# Patient Record
Sex: Female | Born: 1945 | Race: White | Hispanic: No | Marital: Single | Smoking: Never smoker
Health system: Southern US, Community
[De-identification: ages and names within clinical notes are randomized; demographics above are authoritative.]

---

## 2013-05-23 ENCOUNTER — Encounter (HOSPITAL_COMMUNITY): Payer: Self-pay | Admitting: Emergency Medicine

## 2013-05-23 ENCOUNTER — Emergency Department (HOSPITAL_COMMUNITY): Payer: Medicare PPO

## 2013-05-23 ENCOUNTER — Emergency Department (HOSPITAL_COMMUNITY)
Admission: EM | Admit: 2013-05-23 | Discharge: 2013-05-23 | Disposition: A | Discharge status: Home / Self Care | Payer: Medicare PPO | Attending: Emergency Medicine | Admitting: Emergency Medicine

## 2013-05-23 DIAGNOSIS — W010XXA Fall on same level from slipping, tripping and stumbling without subsequent striking against object, initial encounter: Secondary | ICD-10-CM | POA: Insufficient documentation

## 2013-05-23 DIAGNOSIS — W19XXXA Unspecified fall, initial encounter: Secondary | ICD-10-CM

## 2013-05-23 DIAGNOSIS — S82102A Unspecified fracture of upper end of left tibia, initial encounter for closed fracture: Secondary | ICD-10-CM

## 2013-05-23 DIAGNOSIS — Z79899 Other long term (current) drug therapy: Secondary | ICD-10-CM | POA: Insufficient documentation

## 2013-05-23 DIAGNOSIS — Y92009 Unspecified place in unspecified non-institutional (private) residence as the place of occurrence of the external cause: Secondary | ICD-10-CM | POA: Insufficient documentation

## 2013-05-23 DIAGNOSIS — S82109A Unspecified fracture of upper end of unspecified tibia, initial encounter for closed fracture: Secondary | ICD-10-CM | POA: Insufficient documentation

## 2013-05-23 DIAGNOSIS — Y9301 Activity, walking, marching and hiking: Secondary | ICD-10-CM | POA: Insufficient documentation

## 2013-05-23 DIAGNOSIS — Z23 Encounter for immunization: Secondary | ICD-10-CM | POA: Insufficient documentation

## 2013-05-23 IMAGING — CR DG KNEE COMPLETE 4+V*L*
5 series · 5 of 5 positions shown · non-contrast
Comparison: None.

CLINICAL DATA: Status post fall.  Left knee pain.

EXAM:
LEFT KNEE - COMPLETE 4+ VIEW

[t knee ap left]
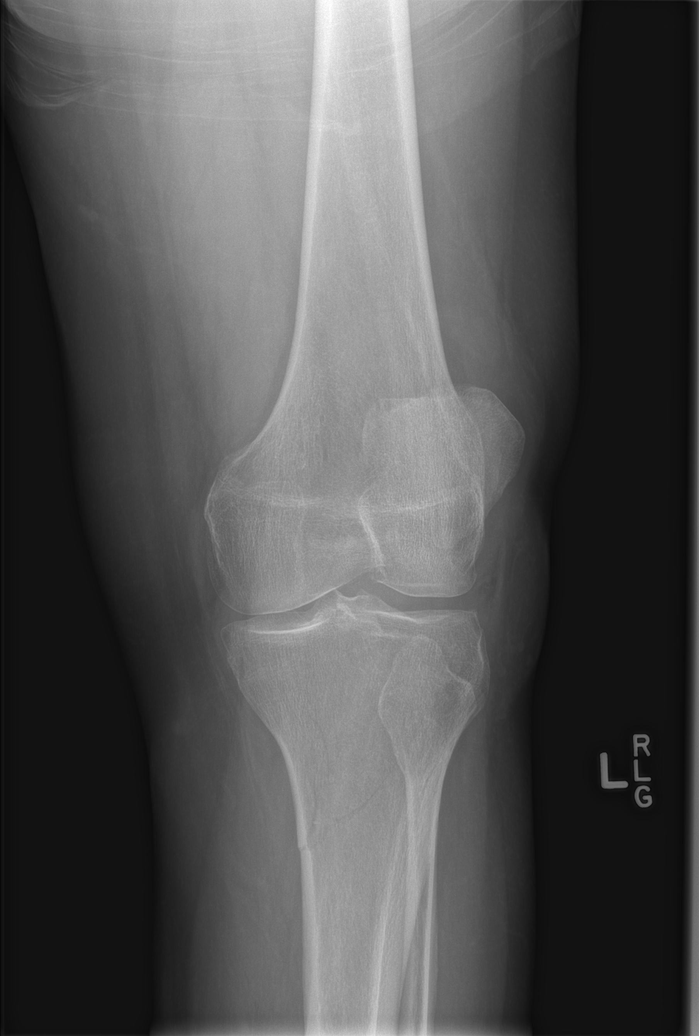

[t knee obl left (1 of 2)]
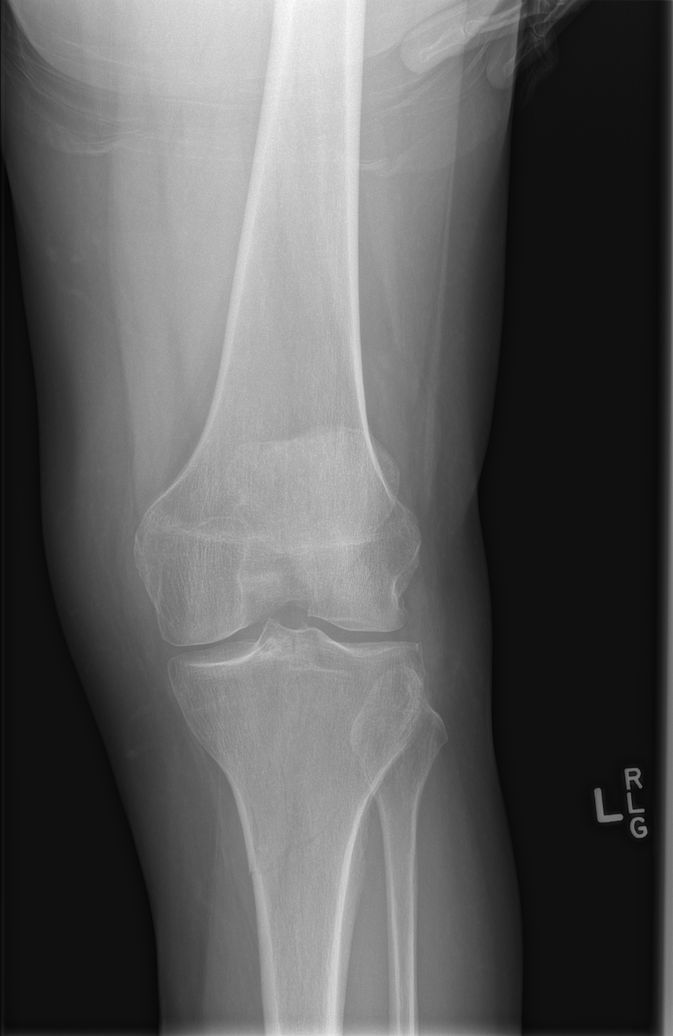

[t knee obl left (2 of 2)]
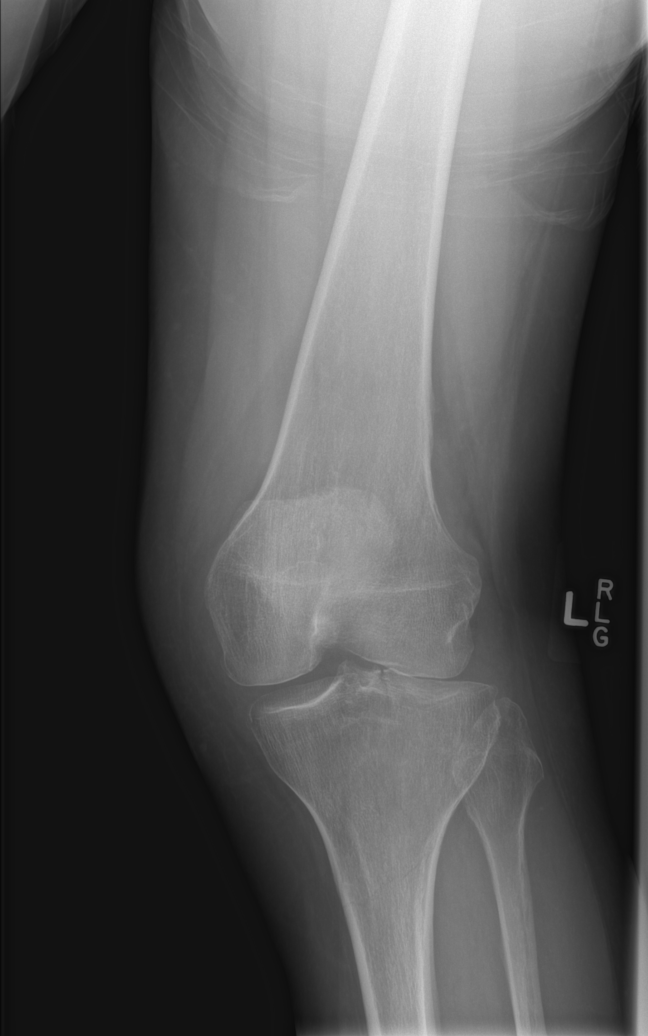

[t knee lat left (1 of 2)]
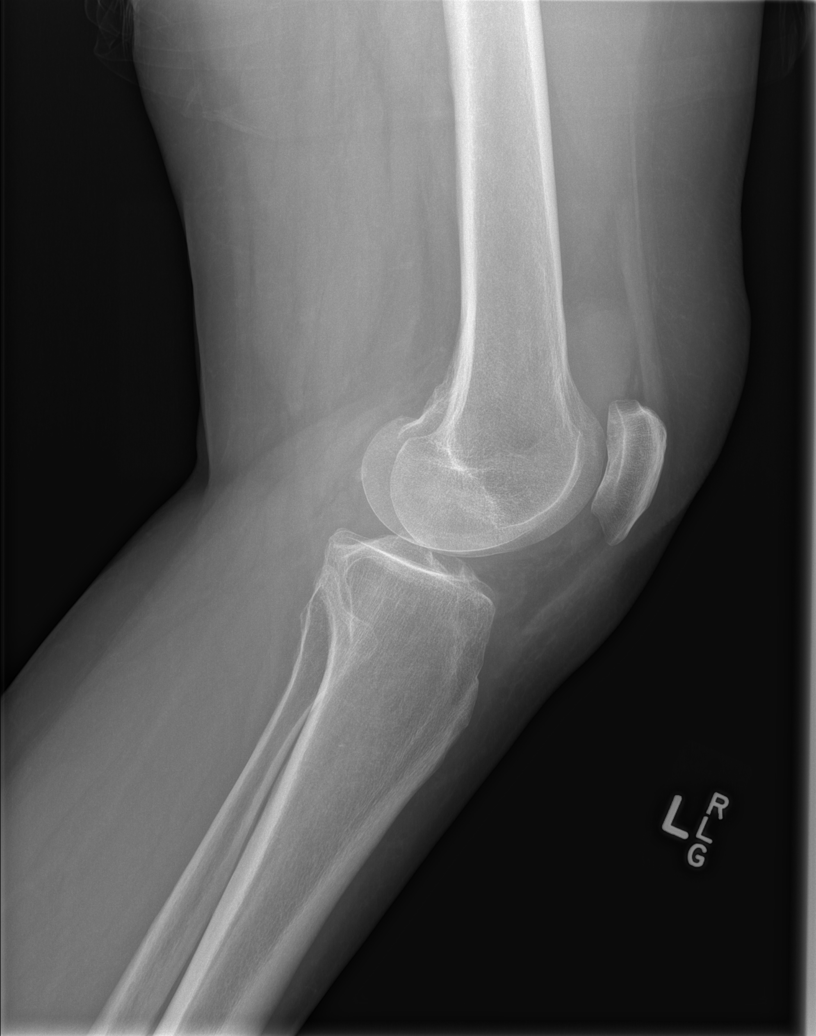

[t knee lat left (2 of 2)]
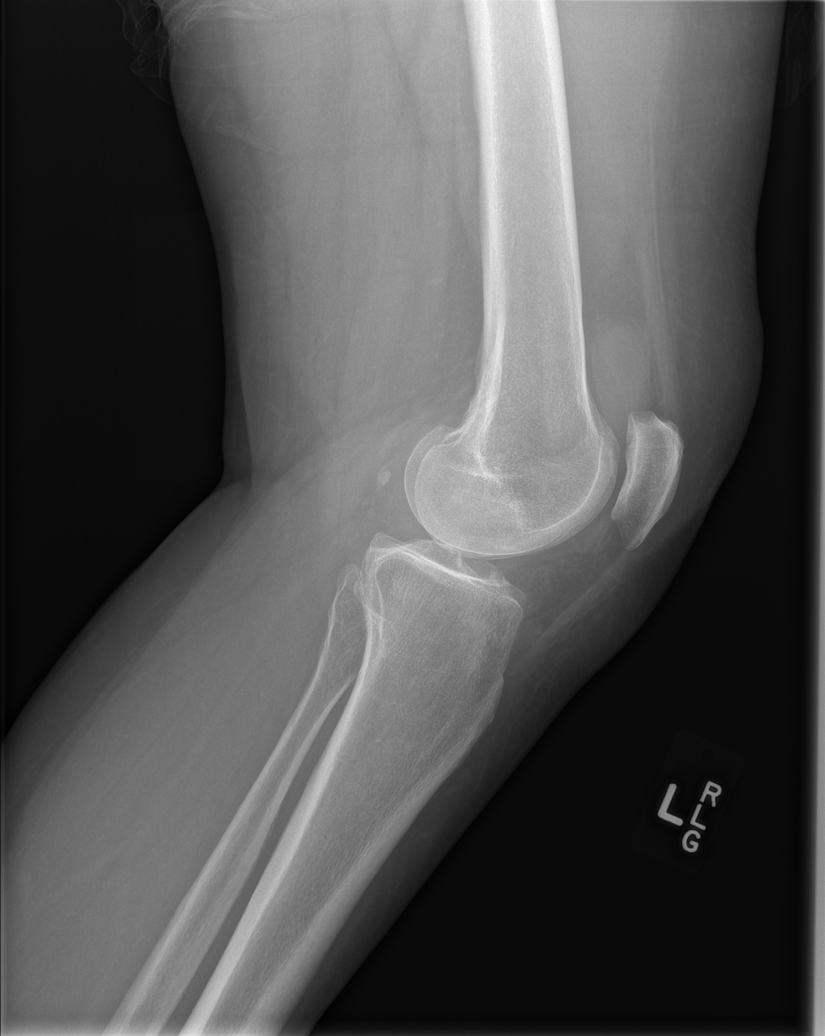

[5 of 5 positions shown; findings below may reference images not displayed]

FINDINGS: The patient has a nondisplaced tibial fracture. The fracture extends
from the tibial eminences inferiorly through the medial tibial
metaphysis 8.5 cm below the medial tibial plateau. Associated joint
effusion is noted. No other acute abnormality is identified.
IMPRESSION: Nondisplaced proximal tibial fracture extends from the tibial
eminences into the tibial diaphysis approximately 8.5 cm below the
medial tibial plateau Associated joint effusion is noted.

## 2013-05-23 MED ORDER — TETANUS-DIPHTH-ACELL PERTUSSIS 5-2.5-18.5 LF-MCG/0.5 IM SUSP
0.5000 mL | Freq: Once | INTRAMUSCULAR | Status: AC
  Administered 2013-05-23: 0.5 mL via INTRAMUSCULAR
  Filled 2013-05-23: qty 0.5

## 2013-05-23 MED ORDER — OXYCODONE-ACETAMINOPHEN 5-325 MG PO TABS: 1.0000 | ORAL_TABLET | ORAL | Status: AC | PRN

## 2013-05-23 MED ORDER — MORPHINE SULFATE 4 MG/ML IJ SOLN
4.0000 mg | Freq: Once | INTRAMUSCULAR | Status: AC
Start: 2013-05-23 — End: 2013-05-23
  Administered 2013-05-23: 4 mg via INTRAVENOUS
  Filled 2013-05-23: qty 1

## 2013-05-23 NOTE — ED Provider Notes (Signed)
CSN: [id number]     Arrival date & time 05/23/13  1013 History   First MD Initiated Contact with Patient 05/23/13 1050     Chief Complaint  Patient presents with  . Knee Injury  . Fall     (Consider location/radiation/quality/duration/timing/severity/associated sxs/prior Treatment) HPI 68 year old female presents after tripping/slipping on gravel and landing on her left knee. She states initially she was able to get up and hobble over to her car to get her phone. Since then she's not been able to bear weight. Her pain is mostly located inferior to her in the or proximal tibia. She has an abrasion that her daughter help clean out. She denies any weakness or numbness. She's noticed swelling to her knee. She's never had any orthopedic problems before. She rates the pain is about a 1/10 which nothing moved to become more severe with any type of movement. She's unsure of the last time she had a tetanus update.  History reviewed. No pertinent past medical history. History reviewed. No pertinent past surgical history. No family history on file. History  Substance Use Topics  . Smoking status: Never Smoker   . Smokeless tobacco: Not on file  . Alcohol Use: No   OB History   Grav Para Term Preterm Abortions TAB SAB Ect Mult Living                 Review of Systems  Constitutional: Negative for fever.  Musculoskeletal: Positive for joint swelling.  Skin: Positive for wound.  Neurological: Negative for syncope, weakness, numbness and headaches.  All other systems reviewed and are negative.     Allergies  Review of patient's allergies indicates no known allergies.  Home Medications   Current Outpatient Rx  Name  Route  Sig  Dispense  Refill  . calcium carbonate 200 MG capsule   Oral   Take 250 mg by mouth 2 (two) times daily with a meal.         . citalopram (CELEXA) 40 MG tablet   Oral   Take 40 mg by mouth daily.         Marland Kitchen. glucosamine-chondroitin 500-400 MG tablet  Oral   Take 1 tablet by mouth daily.         . Multiple Vitamin (MULTIVITAMIN) tablet   Oral   Take 1 tablet by mouth daily.         . niacin 500 MG tablet   Oral   Take 500 mg by mouth at bedtime.         . Omega-3 Fatty Acids (FISH OIL) 1000 MG CAPS   Oral   Take 1 capsule by mouth 2 (two) times daily.          BP 130/81  Pulse 69  Temp(Src) 99.4 F (37.4 C) (Oral)  Resp 18  SpO2 96% Physical Exam  Nursing note and vitals reviewed. Constitutional: She is oriented to person, place, and time. She appears well-developed and well-nourished.  HENT:  Head: Normocephalic and atraumatic.  Right Ear: External ear normal.  Left Ear: External ear normal.  Nose: Nose normal.  Eyes: Right eye exhibits no discharge. Left eye exhibits no discharge.  Cardiovascular: Normal rate and regular rhythm.   Pulses:      Dorsalis pedis pulses are 2+ on the left side.  Pulmonary/Chest: Effort normal.  Abdominal: Soft. She exhibits no distension.  Musculoskeletal:       Left knee: She exhibits swelling and laceration (abrasion to inferolateral knee). Tenderness found.  Left lower leg: She exhibits tenderness, bony tenderness and swelling.  Neurological: She is alert and oriented to person, place, and time.  Skin: Skin is warm and dry.    ED Course  Procedures (including critical care time) Labs Review Labs Reviewed - No data to display Imaging Review Dg Knee Complete 4 Views Left  05/23/2013   CLINICAL DATA:  Status post fall.  Left knee pain.  EXAM: LEFT KNEE - COMPLETE 4+ VIEW  COMPARISON:  None.  FINDINGS: The patient has a nondisplaced tibial fracture. The fracture extends from the tibial eminences inferiorly through the medial tibial metaphysis 8.5 cm below the medial tibial plateau. Associated joint effusion is noted. No other acute abnormality is identified.  IMPRESSION: Nondisplaced proximal tibial fracture extends from the tibial eminences into the tibial diaphysis  approximately 8.5 cm below the medial tibial plateau Associated joint effusion is noted.   Electronically Signed   By: Drusilla Kanner M.D.   On: 05/23/2013 11:22     EKG Interpretation None      MDM   Final diagnoses:  Fracture of tibia, proximal, left, closed  Fall    The patient is neurovascularly intact on exam. She has only mild pain at this time as long as her knee his not being moved. She has soft compartments diffusely in her leg. Her abrasion is superficial. Her tetanus was updated in the ER. I discussed with orthopedics, Dr. Jerl Santos, who reviewed the images and recommends knee immobilizer, touchdown weightbearing, crutches, pain control, and he will see her in his office next week. I discussed early signs of compartment syndrome and what to watch for. The patient verbalized understanding and will return if she develops any concerning symptoms.    Audree Camel, MD 05/23/13 [phone]

## 2013-05-23 NOTE — ED Notes (Signed)
Pt states that she was walking on the curb at her house trying to get few things done before she spends the weekend with her 699 yr old granddaughter and slipped in gravel and fell.  Pt c/o left knee pain, pt does have abrasion on left knee.

## 2013-05-23 NOTE — ED Notes (Signed)
I cleanse her left knee wound with Zephiran, then apply Neosporin/dressing as our ortho. Tech. Applies knee immobilizer.  She remains in no distress; and I advise her to phone her ride home.

## 2014-11-23 ENCOUNTER — Other Ambulatory Visit: Payer: Self-pay

## 2014-11-23 ENCOUNTER — Other Ambulatory Visit: Payer: Self-pay | Admitting: Family Medicine

## 2014-11-23 DIAGNOSIS — M8580 Other specified disorders of bone density and structure, unspecified site: Secondary | ICD-10-CM

## 2014-11-23 DIAGNOSIS — Z1231 Encounter for screening mammogram for malignant neoplasm of breast: Secondary | ICD-10-CM

## 2014-12-25 ENCOUNTER — Ambulatory Visit
Admission: RE | Admit: 2014-12-25 | Discharge: 2014-12-25 | Disposition: A | Discharge status: Home / Self Care | Payer: Medicare HMO | Source: Ambulatory Visit

## 2014-12-25 ENCOUNTER — Other Ambulatory Visit: Payer: Medicare PPO

## 2014-12-25 ENCOUNTER — Ambulatory Visit: Payer: Medicare PPO

## 2014-12-25 ENCOUNTER — Ambulatory Visit
Admission: RE | Admit: 2014-12-25 | Discharge: 2014-12-25 | Disposition: A | Discharge status: Home / Self Care | Payer: Medicare HMO | Source: Ambulatory Visit | Attending: Family Medicine | Admitting: Family Medicine

## 2014-12-25 DIAGNOSIS — M8580 Other specified disorders of bone density and structure, unspecified site: Secondary | ICD-10-CM

## 2014-12-25 DIAGNOSIS — Z1231 Encounter for screening mammogram for malignant neoplasm of breast: Secondary | ICD-10-CM

## 2015-12-17 ENCOUNTER — Other Ambulatory Visit: Payer: Self-pay | Admitting: Family Medicine

## 2015-12-17 DIAGNOSIS — Z1231 Encounter for screening mammogram for malignant neoplasm of breast: Secondary | ICD-10-CM

## 2015-12-27 ENCOUNTER — Ambulatory Visit: Payer: Medicare HMO

## 2016-01-13 ENCOUNTER — Other Ambulatory Visit: Payer: Self-pay | Admitting: Oncology

## 2016-02-21 ENCOUNTER — Ambulatory Visit
Admission: RE | Admit: 2016-02-21 | Discharge: 2016-02-21 | Disposition: A | Discharge status: Home / Self Care | Payer: Medicare HMO | Source: Ambulatory Visit | Attending: Family Medicine | Admitting: Family Medicine

## 2016-02-21 DIAGNOSIS — Z1231 Encounter for screening mammogram for malignant neoplasm of breast: Secondary | ICD-10-CM

## 2016-12-21 ENCOUNTER — Other Ambulatory Visit: Payer: Self-pay | Admitting: Family Medicine

## 2016-12-21 DIAGNOSIS — M8580 Other specified disorders of bone density and structure, unspecified site: Secondary | ICD-10-CM

## 2017-01-17 ENCOUNTER — Other Ambulatory Visit: Payer: Self-pay | Admitting: Family Medicine

## 2017-01-17 DIAGNOSIS — Z1231 Encounter for screening mammogram for malignant neoplasm of breast: Secondary | ICD-10-CM

## 2017-02-23 ENCOUNTER — Ambulatory Visit
Admission: RE | Admit: 2017-02-23 | Discharge: 2017-02-23 | Disposition: A | Discharge status: Home / Self Care | Payer: Medicare PPO | Source: Ambulatory Visit | Attending: Family Medicine | Admitting: Family Medicine

## 2017-02-23 DIAGNOSIS — M8580 Other specified disorders of bone density and structure, unspecified site: Secondary | ICD-10-CM

## 2017-02-23 DIAGNOSIS — Z1231 Encounter for screening mammogram for malignant neoplasm of breast: Secondary | ICD-10-CM

## 2017-03-15 ENCOUNTER — Other Ambulatory Visit: Payer: Medicare PPO

## 2017-04-05 ENCOUNTER — Ambulatory Visit
Admission: RE | Admit: 2017-04-05 | Discharge: 2017-04-05 | Disposition: A | Discharge status: Home / Self Care | Payer: Medicare PPO | Source: Ambulatory Visit | Attending: Family Medicine | Admitting: Family Medicine

## 2019-03-23 ENCOUNTER — Ambulatory Visit: Payer: Medicare Other | Attending: Internal Medicine

## 2019-03-23 DIAGNOSIS — Z23 Encounter for immunization: Secondary | ICD-10-CM | POA: Insufficient documentation

## 2019-03-23 NOTE — Progress Notes (Signed)
   Covid-19 Vaccination Clinic  Name:  Kimberly Alexander    MRN: [id number] DOB: Sep 03, 1945  03/23/2019  [name] was observed post Covid-19 immunization for 15 minutes without incidence. She was provided with Vaccine Information Sheet and instruction to access the V-Safe system.   [name] was instructed to call 911 with any severe reactions post vaccine: Marland Kitchen Difficulty breathing  . Swelling of your face and throat  . A fast heartbeat  . A bad rash all over your body  . Dizziness and weakness    Immunizations Administered    Name Date Dose VIS Date Route   Pfizer COVID-19 Vaccine 03/23/2019  4:00 PM 0.3 mL 01/24/2019 Intramuscular   Manufacturer: [employer], [employer]   Lot: [id number]   NDC: [address]-4

## 2019-04-08 ENCOUNTER — Ambulatory Visit: Payer: Medicare PPO

## 2019-04-16 ENCOUNTER — Ambulatory Visit: Payer: Medicare Other

## 2019-04-16 ENCOUNTER — Ambulatory Visit: Payer: Medicare Other | Attending: Internal Medicine

## 2019-04-16 DIAGNOSIS — Z23 Encounter for immunization: Secondary | ICD-10-CM | POA: Insufficient documentation

## 2019-04-16 NOTE — Progress Notes (Signed)
   Covid-19 Vaccination Clinic  Name:  Kimberly Alexander    MRN: [id number] DOB: 09-Apr-1945  04/16/2019  [name] was observed post Covid-19 immunization for 15 minutes without incident. She was provided with Vaccine Information Sheet and instruction to access the V-Safe system.   [name] was instructed to call 911 with any severe reactions post vaccine: Marland Kitchen Difficulty breathing  . Swelling of face and throat  . A fast heartbeat  . A bad rash all over body  . Dizziness and weakness   Immunizations Administered    Name Date Dose VIS Date Route   Pfizer COVID-19 Vaccine 04/16/2019  8:58 AM 0.3 mL 01/24/2019 Intramuscular   Manufacturer: [employer], [employer]   Lot: [id number]   NDC: [address]-4

## 2021-08-15 ENCOUNTER — Emergency Department (HOSPITAL_COMMUNITY): Payer: Medicare (Managed Care)

## 2021-08-15 ENCOUNTER — Encounter (HOSPITAL_COMMUNITY): Payer: Self-pay | Admitting: Pharmacy Technician

## 2021-08-15 ENCOUNTER — Emergency Department (HOSPITAL_COMMUNITY)
Admission: EM | Admit: 2021-08-15 | Discharge: 2021-08-15 | Disposition: A | Discharge status: Home / Self Care | Payer: Medicare (Managed Care) | Attending: Emergency Medicine | Admitting: Emergency Medicine

## 2021-08-15 ENCOUNTER — Other Ambulatory Visit: Payer: Self-pay

## 2021-08-15 DIAGNOSIS — M25531 Pain in right wrist: Secondary | ICD-10-CM

## 2021-08-15 DIAGNOSIS — S52531A Colles' fracture of right radius, initial encounter for closed fracture: Secondary | ICD-10-CM | POA: Diagnosis not present

## 2021-08-15 DIAGNOSIS — W109XXA Fall (on) (from) unspecified stairs and steps, initial encounter: Secondary | ICD-10-CM | POA: Diagnosis not present

## 2021-08-15 MED ORDER — ACETAMINOPHEN 325 MG PO TABS
650.0000 mg | ORAL_TABLET | Freq: Four times a day (QID) | ORAL | Status: DC | PRN
  Administered 2021-08-15: 650 mg via ORAL
  Filled 2021-08-15: qty 2

## 2021-08-15 NOTE — Discharge Instructions (Addendum)
Contact information for local orthopedic hand specialist been provided for you by the name of Dr. Melvyn Novas.  Please call and schedule a follow-up appointment within the next few days for reevaluation continue medical management.  Continue to utilize RICE therapy, anti-inflammatories such as ibuprofen or Motrin, Tylenol, and plenty of rest.  Return to the ED for new or worsening symptoms as discussed

## 2021-08-15 NOTE — ED Provider Triage Note (Signed)
Emergency Medicine Provider Triage Evaluation Note  Kimberly Alexander , a 76 y.o. female  was evaluated in triage.  Pt complains of right wrist pain following tripping up the stairs.  Patient noted some swelling of her right wrist and forearm.  She endorses some pain with motion.  Patient denies any numbness, tingling.  Review of Systems  Positive: Wrist pain Negative: Numbness, tingling  Physical Exam  BP 132/74 (BP Location: Left Arm)   Pulse 66   Temp 99.3 F (37.4 C) (Oral)   Resp 15   SpO2 95%  Gen:   Awake, no distress   Resp:  Normal effort  MSK:   Moves extremities without difficulty  Other:  Ttp right wrist without stepoff or deformity, intact ROM, radial, ulnar pulses 2+ in right  Medical Decision Making  Medically screening exam initiated at 1:23 PM.  Appropriate orders placed.  Kimberly Alexander was informed that the remainder of the evaluation will be completed by another provider, this initial triage assessment does not replace that evaluation, and the importance of remaining in the ED until their evaluation is complete.  Workup initiated   Kimberly Alexander, [place] 08/15/21 1323

## 2021-08-15 NOTE — ED Triage Notes (Signed)
Pt here with reports of tripping up the stairs and catching herself with her R hand. Now swelling noted around the wrist and forearm.

## 2021-08-15 NOTE — ED Provider Notes (Signed)
[facility] EMERGENCY DEPARTMENT Provider Note   CSN: [id number] Arrival date & time: 08/15/21  1231     History {Add pertinent medical, surgical, social history, OB history to HPI:1} Chief Complaint  Patient presents with   Wrist Pain    Kimberly Alexander is a 76 y.o. female with chief complaint of right wrist pain after tripping and falling going up the stairs.  [place] [address] forward onto her right hand, felt instant pain.  Denies numbness or tingling to the extremities.  Soreness wrist motion.  Denies feeling lightheaded or dizzy, reassures it was purely a mechanical cause.  No other complaints at this time  The history is provided by the patient and medical records.  Wrist Pain       Home Medications Prior to Admission medications   Medication Sig Start Date End Date Taking? Authorizing Provider  calcium carbonate 200 MG capsule Take 250 mg by mouth 2 (two) times daily with a meal.    [provider]  citalopram (CELEXA) 40 MG tablet Take 40 mg by mouth daily.    [provider]  glucosamine-chondroitin 500-400 MG tablet Take 1 tablet by mouth daily.    [provider]  Multiple Vitamin (MULTIVITAMIN) tablet Take 1 tablet by mouth daily.    [provider]  niacin 500 MG tablet Take 500 mg by mouth at bedtime.    [provider]  Omega-3 Fatty Acids (FISH OIL) 1000 MG CAPS Take 1 capsule by mouth 2 (two) times daily.    [provider]  oxyCODONE-acetaminophen (PERCOCET) 5-325 MG per tablet Take 1-2 tablets by mouth every 4 (four) hours as needed. 05/23/13   Sherwood Gambler, MD      Allergies    Patient has no known allergies.    Review of Systems   Review of Systems  Musculoskeletal:        Right wrist pain    Physical Exam Updated Vital Signs BP 132/74 (BP Location: Left Arm)   Pulse 66   Temp 99.3 F (37.4 C) (Oral)   Resp 15   SpO2 95%  Physical Exam Vitals and nursing note reviewed.   Constitutional:      General: She is not in acute distress.    Appearance: Normal appearance. She is well-developed. She is not ill-appearing, toxic-appearing or diaphoretic.  HENT:     Head: Normocephalic and atraumatic.  Eyes:     Conjunctiva/sclera: Conjunctivae normal.  Cardiovascular:     Rate and Rhythm: Normal rate and regular rhythm.     Pulses: Normal pulses.     Heart sounds: No murmur heard. Pulmonary:     Effort: Pulmonary effort is normal. No respiratory distress.     Breath sounds: Normal breath sounds.  Abdominal:     Palpations: Abdomen is soft.     Tenderness: There is no abdominal tenderness.  Musculoskeletal:        General: No swelling.     Cervical back: Neck supple.     Comments: Mild tenderness and swelling of the right wrist, with mild bony deformity.  Sensation appears grossly intact.  Radial pulses 2+.  CRT less than 2 seconds bilaterally.  No discoloration or open wound appreciated.  ROM deferred due to possibility of fracture.  Skin:    General: Skin is warm and dry.     Capillary Refill: Capillary refill takes less than 2 seconds.  Neurological:     Mental Status: She is alert and oriented to person, place, and  time.  Psychiatric:        Mood and Affect: Mood normal.     ED Results / Procedures / Treatments   Labs (all labs ordered are listed, but only abnormal results are displayed) Labs Reviewed - No data to display  EKG None  Radiology DG Wrist Complete Right  Result Date: 08/15/2021 CLINICAL DATA:  Tripped and fell with injury to the wrist. Pain and swelling EXAM: RIGHT WRIST - COMPLETE 3+ VIEW COMPARISON:  None FINDINGS: Question subtle fracture of the distal radius, possibly indicating a minimal Colles type fracture. No fracture of the ulna. No fracture of the carpal bones. Chronic degenerative change of the radial side carpal articulations. IMPRESSION: Question minimal Colles type fracture of the distal radius. Some possibility this could  be old or subacute. Ordinary radial side degenerative changes of the carpus. Electronically Signed   By: Paulina Fusi M.D.   On: 08/15/2021 13:59    Procedures Procedures  {Document cardiac monitor, telemetry assessment procedure when appropriate:1}  Medications Ordered in ED Medications  acetaminophen (TYLENOL) tablet 650 mg (650 mg Oral Given 08/15/21 1631)    ED Course/ Medical Decision Making/ A&P                           Medical Decision Making Risk OTC drugs.   76 y.o. female presents to the ED for concern of Wrist Pain   This involves an extensive number of treatment options, and is a complaint that carries with it a high risk of complications and morbidity.  The emergent differential diagnosis prior to evaluation includes, but is not limited to: Fracture, dislocation, contusion, sprain  This is not an exhaustive differential.   Past Medical History / Co-morbidities / Social History: Hx of depression  Additional History:  Internal and external records from outside source obtained and reviewed including Family medicine visits  Lab Tests: None  Imaging Studies: I ordered imaging studies including XR right wrist .   I independently visualized and interpreted imaging which showed possible Colles' fracture with minimal displacement I agree with the radiologist interpretation.  ED Course: Pt well-appearing on exam.  Presents with mild tenderness and swelling of the right wrist due to recent fall on outstretched hand injury.  Denies numbness, tingling, or weakness of the affected extremity.  Pt X-Ray with evidence of mild minimally displaced possible Colles' fracture.  Pain managed in ED.  Extremity appears neurovascularly intact.  No evidence of ischemia or compartment syndrome.  Pt advised to follow up with orthopedics for further evaluation and treatment within the next few days.  Patient given sugar-tong splint and sling while in ED.  After administration, extremity  rechecked and remains neurovascularly intact.  Recommended RICE therapy and conservative symptom management until orthopedic follow up.  Patient in NAD and in good condition at time of discharge.  Disposition: After consideration the patient's encounter today, I do not feel today's workup suggests an emergent condition requiring admission or immediate intervention beyond what has been performed at this time.  Safe for discharge; instructed to return immediately for worsening symptoms, change in symptoms or any other concerns.  I have reviewed the patients home medicines and have made adjustments as needed.  Discussed course of treatment with the patient, whom demonstrated understanding.  Patient in agreement and has no further questions.    I discussed this case with my attending physician Dr. Estell Harpin, who agreed with the proposed treatment course and cosigned this note  including patient's presenting symptoms, physical exam, and planned diagnostics and interventions.  Attending physician stated agreement with plan or made changes to plan which were implemented.     This chart was dictated using voice recognition software.  Despite best efforts to proofread, errors can occur which can change the documentation meaning.   {Document critical care time when appropriate:1} {Document review of labs and clinical decision tools ie heart score, Chads2Vasc2 etc:1}  {Document your independent review of radiology images, and any outside records:1} {Document your discussion with family members, caretakers, and with consultants:1} {Document social determinants of health affecting pt's care:1} {Document your decision making why or why not admission, treatments were needed:1} Final Clinical Impression(s) / ED Diagnoses Final diagnoses:  Closed Colles' fracture of right radius, initial encounter  Acute pain of right wrist    Rx / DC Orders ED Discharge Orders     None
